# Patient Record
Sex: Female | Born: 1935
Health system: Southern US, Community
[De-identification: ages and names within clinical notes are randomized; demographics above are authoritative.]

## PROBLEM LIST (undated history)

## (undated) DIAGNOSIS — K219 Gastro-esophageal reflux disease without esophagitis: Secondary | ICD-10-CM

## (undated) DIAGNOSIS — K921 Melena: Secondary | ICD-10-CM

## (undated) DIAGNOSIS — F32A Depression, unspecified: Secondary | ICD-10-CM

## (undated) DIAGNOSIS — M1990 Unspecified osteoarthritis, unspecified site: Secondary | ICD-10-CM

## (undated) DIAGNOSIS — N390 Urinary tract infection, site not specified: Secondary | ICD-10-CM

## (undated) DIAGNOSIS — S62109A Fracture of unspecified carpal bone, unspecified wrist, initial encounter for closed fracture: Secondary | ICD-10-CM

## (undated) DIAGNOSIS — E079 Disorder of thyroid, unspecified: Secondary | ICD-10-CM

## (undated) DIAGNOSIS — M4850XA Collapsed vertebra, not elsewhere classified, site unspecified, initial encounter for fracture: Secondary | ICD-10-CM

## (undated) DIAGNOSIS — C801 Malignant (primary) neoplasm, unspecified: Secondary | ICD-10-CM

## (undated) DIAGNOSIS — F419 Anxiety disorder, unspecified: Secondary | ICD-10-CM

## (undated) DIAGNOSIS — F329 Major depressive disorder, single episode, unspecified: Secondary | ICD-10-CM

## (undated) HISTORY — DX: Malignant (primary) neoplasm, unspecified: C80.1

## (undated) HISTORY — DX: Disorder of thyroid, unspecified: E07.9

## (undated) HISTORY — DX: Urinary tract infection, site not specified: N39.0

## (undated) HISTORY — DX: Melena: K92.1

## (undated) HISTORY — DX: Unspecified osteoarthritis, unspecified site: M19.90

## (undated) HISTORY — DX: Depression, unspecified: F32.A

## (undated) HISTORY — DX: Major depressive disorder, single episode, unspecified: F32.9

---

## 1968-01-01 HISTORY — PX: ABDOMINAL HYSTERECTOMY: SHX81

## 1978-12-31 HISTORY — PX: OTHER SURGICAL HISTORY: SHX169

## 2000-01-01 HISTORY — PX: CHOLECYSTECTOMY: SHX55

## 2000-10-01 ENCOUNTER — Encounter: Payer: Self-pay | Admitting: General Surgery

## 2000-10-01 ENCOUNTER — Observation Stay (HOSPITAL_COMMUNITY): Admission: RE | Admit: 2000-10-01 | Discharge: 2000-10-02 | Payer: Self-pay | Admitting: General Surgery

## 2015-05-04 ENCOUNTER — Encounter (HOSPITAL_COMMUNITY): Payer: Self-pay

## 2015-05-09 ENCOUNTER — Inpatient Hospital Stay (HOSPITAL_COMMUNITY): Admission: RE | Admit: 2015-05-09 | Payer: Self-pay | Source: Ambulatory Visit

## 2018-10-13 ENCOUNTER — Telehealth: Payer: Self-pay | Admitting: General Practice

## 2018-10-13 NOTE — Telephone Encounter (Signed)
Copied from [place] [phone]. Topic: Appointment Scheduling - Prior Auth Required for Appointment >> Oct 10, 2018 12:37 PM Wynetta Emery, [place] C wrote: No appointment has been scheduled. Patient is requesting appointment with Dr. Carollee Herter.  Per scheduling protocol, this appointment requires a prior authorization prior to scheduling.--- She said that Erline Levine with [facility] suggested provider to her during a session.  Will PCP take pt on?  Please assist.   Route to department's PEC pool.   CB: [phone] >> Oct 10, 2018 12:45 PM Damita Dunnings, [place] wrote: Dr. Etter Sjogren not currently accepting new patients.  >> Oct 13, 2018  4:32 PM Genella Rife H wrote: Hulen Skains pt and left message Dr. Carollee Herter is not taking on new patients but Dr. Nani Ravens and Mackie Pai is taking on new patients and if she wants to set up appt please give [place] a call back.

## 2018-10-22 NOTE — Telephone Encounter (Signed)
Advised pt that Dr. Carollee Herter is not accepting new pt's at this time.  The pt would like to be put on some sort of waiting list per her request. She would like to be notified once she start to accept new pt's.

## 2018-10-27 ENCOUNTER — Other Ambulatory Visit: Payer: Self-pay

## 2018-10-27 ENCOUNTER — Emergency Department (HOSPITAL_BASED_OUTPATIENT_CLINIC_OR_DEPARTMENT_OTHER)
Admission: EM | Admit: 2018-10-27 | Discharge: 2018-10-27 | Disposition: A | Discharge status: Home / Self Care | Payer: Medicare Other | Attending: Emergency Medicine | Admitting: Emergency Medicine

## 2018-10-27 DIAGNOSIS — X58XXXS Exposure to other specified factors, sequela: Secondary | ICD-10-CM | POA: Diagnosis not present

## 2018-10-27 DIAGNOSIS — S32020S Wedge compression fracture of second lumbar vertebra, sequela: Secondary | ICD-10-CM | POA: Diagnosis not present

## 2018-10-27 DIAGNOSIS — M5489 Other dorsalgia: Secondary | ICD-10-CM | POA: Diagnosis present

## 2018-10-27 MED ORDER — TRAMADOL HCL 50 MG PO TABS: 50.0000 mg | ORAL_TABLET | Freq: Four times a day (QID) | ORAL | 0 refills | Status: DC | PRN

## 2018-10-27 MED FILL — traMADol HCL 50 MG TABS: 50 | 3 days supply | Qty: 15 | Fill #0

## 2018-10-27 NOTE — ED Provider Notes (Signed)
[facility]GENC[facility] DEPARTMENT Provider Note   CSN: [id number] Arrival date & time: 10/27/18  1130     History   Chief Complaint Chief Complaint  Patient presents with  . Back Pain    HPI Tammy Brown is a 82 y.o. female who presents emergency department with chief complaint of back pain.  The patient has a known L2 compression fracture that occurred back in early July of this year.  The patient states that she was discharged with pain medicine and a TLSO's splint but could not tolerate either of them.  She followed up outpatient with an orthopedic spine specialist and had an MRI on 07/28/2018 that showed no significant abnormalities.  They did at that time recommend kyphoplasty however the patient refused.  She has been taking medicine for pain but ran out and states that she is continued to have pain in her low back.  She states that she thinks it is her muscles or her nerves.  She has had difficulty sleeping.  Her husband says that she is so stressed out and nervous that he feels like her muscles cannot relax and think she needs something for stress and sleep.  She denies pain radiating down her leg, urinary incontinence or bowel incontinence, weakness of the lower extremities.  Her pain has not gotten any worse it is the same over the past several days.  She is declining imaging at this time because she has a follow-up appointment.  HPI  No past medical history on file.  There are no active problems to display for this patient.   The histories are not reviewed yet. Please review them in the "History" navigator section and refresh this [place].   OB History   None      Home Medications    Prior to Admission medications   Not on File    Family History No family history on file.  Social History Social History   Tobacco Use  . Smoking status: Not on file  Substance Use Topics  . Alcohol use: Not on file  . Drug use: Not on file     Allergies     Codeine   Review of Systems Review of Systems Ten systems reviewed and are negative for acute change, except as noted in the HPI.    Physical Exam Updated Vital Signs BP (!) 148/85 (BP Location: Right Arm)   Pulse 67   Temp 98.3 F (36.8 C) (Oral)   Resp 18   SpO2 98%   Physical Exam  Physical Exam  Nursing note and vitals reviewed. Constitutional: She is oriented to person, place, and time. She appears well-developed and well-nourished. No distress.  HENT:  Head: Normocephalic and atraumatic.  Eyes: Conjunctivae normal and EOM are normal. Pupils are equal, round, and reactive to light. No scleral icterus.  Neck: Normal range of motion.  Cardiovascular: Normal rate, regular rhythm and normal heart sounds.  Exam reveals no gallop and no friction rub.   No murmur heard. Pulmonary/Chest: Effort normal and breath sounds normal. No respiratory distress.  Abdominal: Soft. Bowel sounds are normal. She exhibits no distension and no mass. There is no tenderness. There is no guarding.  Musculoskeletal: No midline spinal tenderness.  Tender to palpation of the lumbar paraspinal worse on the left, the upper gluteal muscles.  Normal strength of the bilateral lower extremities.  Normal reflexes.  Normal sensation bilaterally.  Range of motion is guarded. Neurological: She is alert and oriented to person, place,  and time.  Skin: Skin is warm and dry. She is not diaphoretic.    ED Treatments / Results  Labs (all labs ordered are listed, but only abnormal results are displayed) Labs Reviewed - No data to display  EKG None  Radiology No results found.  Procedures Procedures (including critical care time)  Medications Ordered in ED Medications - No data to display   Initial Impression / Assessment and Plan / ED Course  I have reviewed the triage vital signs and the nursing notes.  Pertinent labs & imaging results that were available during my care of the patient were reviewed  by me and considered in my medical decision making (see chart for details).     Patient with pain resulting from L2 compression fracture she does not want any further imaging.  I have reviewed the patient in the [place] drug database in she does not have any controlled substance prescription since July of this year.  I will discharge patient with tramadol.  She should follow-up with her primary care physician.  I discussed with any red flag symptoms.  Patient appears appropriate for discharge at this time.  Final Clinical Impressions(s) / ED Diagnoses   Final diagnoses:  Closed compression fracture of L2 lumbar vertebra, sequela    ED Discharge Orders    None       Margarita Mail, PA-C 10/27/18 1740    Tegeler, Gwenyth Allegra, MD 10/27/18 [phone]

## 2018-10-27 NOTE — Discharge Instructions (Addendum)
Contact a health care provider if: You have a fever. You develop a cough that makes your pain worse. Your pain medicine is not helping. Your pain does not get better over time. You cannot return to your normal activities as planned or expected. Get help right away if: Your pain is very bad and it suddenly gets worse. You are unable to move any body part (paralysis) that is below the level of your injury. You have numbness, tingling, or weakness in any body part that is below the level of your injury. You cannot control your bladder or bowels.

## 2018-10-27 NOTE — ED Triage Notes (Signed)
Pt reports "L2 fx" after lifting heavy object in July-was seen by surgeon-surgery not needed-pt has been taking pain meds and is out of some -states she is having increase in lower back pain-pt NAD

## 2018-11-05 ENCOUNTER — Ambulatory Visit (HOSPITAL_BASED_OUTPATIENT_CLINIC_OR_DEPARTMENT_OTHER)
Admission: RE | Admit: 2018-11-05 | Discharge: 2018-11-05 | Disposition: A | Discharge status: Home / Self Care | Payer: Medicare Other | Source: Ambulatory Visit | Attending: Family Medicine | Admitting: Family Medicine

## 2018-11-05 ENCOUNTER — Ambulatory Visit (INDEPENDENT_AMBULATORY_CARE_PROVIDER_SITE_OTHER): Payer: Medicare Other | Admitting: Family Medicine

## 2018-11-05 ENCOUNTER — Encounter: Payer: Self-pay | Admitting: Family Medicine

## 2018-11-05 VITALS — BP 114/72 | HR 79 | Temp 98.8°F | Ht 64.0 in | Wt 169.1 lb

## 2018-11-05 DIAGNOSIS — X58XXXA Exposure to other specified factors, initial encounter: Secondary | ICD-10-CM | POA: Insufficient documentation

## 2018-11-05 DIAGNOSIS — S32020A Wedge compression fracture of second lumbar vertebra, initial encounter for closed fracture: Secondary | ICD-10-CM | POA: Insufficient documentation

## 2018-11-05 DIAGNOSIS — M545 Low back pain, unspecified: Secondary | ICD-10-CM | POA: Insufficient documentation

## 2018-11-05 DIAGNOSIS — M549 Dorsalgia, unspecified: Secondary | ICD-10-CM | POA: Insufficient documentation

## 2018-11-05 DIAGNOSIS — G8929 Other chronic pain: Secondary | ICD-10-CM | POA: Diagnosis not present

## 2018-11-05 NOTE — Progress Notes (Signed)
Pre visit review using our clinic review tool, if applicable. No additional management support is needed unless otherwise documented below in the visit note. 

## 2018-11-05 NOTE — Patient Instructions (Addendum)
We will be in contact regarding your X-ray. I am thinking your muscle is more the cause than your bone, but we will make sure this is not related to worsening bone issues.   If the X-ray does not show any changes, I think we can ramp up activity at physical therapy.  Heat (pad or rice pillow in microwave) over affected area, 10-15 minutes twice daily.   OK to take Tylenol 1000 mg (2 extra strength tabs) or 975 mg (3 regular strength tabs) every 6 hours as needed.  Let us know if you need anything.

## 2018-11-05 NOTE — Progress Notes (Signed)
Chief Complaint  Patient presents with  . New Patient (Initial Visit)       New Patient Visit SUBJECTIVE: HPI: Tammy Brown is an 82 y.o.female who is being seen for establishing care.  The patient's previous PCP left.   Hx of L2 compression fx. Declined kyphoplasty, surg not required per ortho spine. Has trouble taking PO meds due to AE's. This has been going on for the past 5 mo. Was told this would improve by now. Has been working with PT. Tries to be diligent w HEP. No numbness, tingling, bowel/bladder incontinence. Has had MRI and XR showing compression fx.   Allergies  Allergen Reactions  . Codeine    Past Medical History:  Diagnosis Date  . Arthritis   . Blood in stool   . Cancer [facility])    colon cancer  . Depression   . Thyroid disease   . UTI (urinary tract infection)    Past Surgical History:  Procedure Laterality Date  . ABDOMINAL HYSTERECTOMY  1969  . CHOLECYSTECTOMY  2001  . colon cancer  1980   Family History  Problem Relation Age of Onset  . Cancer Mother   . Early death Mother   . COPD Father   . Arthritis Sister   . COPD Sister    Allergies  Allergen Reactions  . Codeine     Current Outpatient Medications:  .  ascorbic acid (VITAMIN C) 500 MG tablet, Take 500 mg by mouth daily., Disp: , Rfl:  .  b complex vitamins tablet, Take 1 tablet by mouth daily., Disp: , Rfl:  .  Calcium-Magnesium 500-250 MG TABS, Take 1 tablet by mouth daily., Disp: , Rfl:  .  cetirizine (ZYRTEC) 10 MG tablet, Take 10 mg by mouth daily. Spring and fall, Disp: , Rfl:  .  Cholecalciferol (VITAMIN D3) 25 MCG (1000 UT) CAPS, Take 1 capsule by mouth daily., Disp: , Rfl:  .  Coenzyme Q10 (COQ10) 100 MG CAPS, Take 1 capsule by mouth daily., Disp: , Rfl:  .  Melatonin 3-10 MG TABS, Take 1 tablet by mouth daily., Disp: , Rfl:  .  traMADol (ULTRAM) 50 MG tablet, Take 1 tablet (50 mg total) by mouth every 6 (six) hours as needed., Disp: 15 tablet, Rfl: 0  ROS MSK: +back pain   Neuro: Denies numbness/tingling   OBJECTIVE: BP 114/72 (BP Location: Left Arm, Patient Position: Sitting, Cuff Size: Normal)   Pulse 79   Temp 98.8 F (37.1 C) (Oral)   Ht 5\' 4"  (1.626 m)   Wt 169 lb 2 oz (76.7 kg)   SpO2 97%   BMI 29.03 kg/m   Constitutional: -  VS reviewed -  Well developed, well nourished, appears stated age -  No apparent distress  Psychiatric: -  Oriented to person, place, and time -  Memory intact -  Affect and mood normal -  Fluent conversation, good eye contact -  Judgment and insight age appropriate  Eye: -  Conjunctivae clear, no discharge -  Pupils symmetric, round, reactive to light  ENMT: -  MMM    Pharynx moist, no exudate, no erythema  Neck: -  No gross swelling, no palpable masses -  Thyroid midline, not enlarged, mobile, no palpable masses  Cardiovascular: -  RRR -  No LE edema  Respiratory: -  Normal respiratory effort, no accessory muscle use, no retraction -  Breath sounds equal, no wheezes, no ronchi, no crackles  Neurological:  -  CN II - XII  grossly intact -  DTR's equal and symmetric, no clonus -  No cerebellar signs -  Sensation grossly intact to light touch, equal bilaterally  Musculoskeletal: -  No clubbing, no cyanosis -  +ttp over thoracolumb parasp msc on R -  No midline ttp -  Poor rom of hamstrings b/l, neg straight leg b/l  Skin: -  No significant lesion on inspection -  Warm and dry to palpation   ASSESSMENT/PLAN: Closed compression fracture of L2 lumbar vertebra, initial encounter ([place]) - Plan: DG Lumbar Spine Complete  Chronic right-sided low back pain without sciatica  Patient instructed to sign release of records form from her previous PCP. Will ck XR to ensure no worsening of compression fx. If so, will refer to spine specialist. If not, will increase intensity of PT. Heat, ice, Tylenol. Patient should return in 6 mo for AWV. The patient voiced understanding and agreement to the plan.   [place], DO 11/05/18  4:14 PM

## 2018-11-06 ENCOUNTER — Telehealth: Payer: Self-pay | Admitting: Family Medicine

## 2018-11-06 DIAGNOSIS — S32020A Wedge compression fracture of second lumbar vertebra, initial encounter for closed fracture: Secondary | ICD-10-CM

## 2018-11-06 NOTE — Telephone Encounter (Signed)
-----   Message from Jamesetta [place], [place] sent at 11/06/2018  2:08 PM EST ----- Called informed the patient of results/instructions.  The patient would like a call from the provider as to what he thinks may need to be done to correct this problem and where the referral would be sent.

## 2018-11-06 NOTE — Telephone Encounter (Signed)
Referral placed for NS. Pt notified via phone. All questions answered.

## 2019-01-14 ENCOUNTER — Other Ambulatory Visit: Payer: Self-pay | Admitting: Neurosurgery

## 2019-01-20 NOTE — Pre-Procedure Instructions (Signed)
Tammy Brown  01/20/2019      [place], [place] [address] [place] [address] Phone: [phone] Fax: [phone]    Your procedure is scheduled on 01/26/2019.  Report to [facility] Admitting at 1000 A.M.  Call this number if you have problems the morning of surgery:  [phone]   Remember:  Do not eat or drink after midnight.    Take these medicines the morning of surgery with A SIP OF WATER: certirizine (Zyrtec) - if needed  7 days prior to surgery STOP taking any Aspirin (unless otherwise instructed by your surgeon), Aleve, Naproxen, Ibuprofen, Motrin, Advil, Goody's, BC's, all herbal medications, fish oil, and all vitamins.     Do not wear jewelry, make-up or nail polish.  Do not wear lotions, powders, or perfumes, or deodorant.  Do not shave 48 hours prior to surgery.    Do not bring valuables to the hospital.  [facility] is not responsible for any belongings or valuables.  Contacts, eyeglasses, hearing aids, dentures or bridgework may not be worn into surgery.  Leave your suitcase in the car.  After surgery it may be brought to your room.  For patients admitted to the hospital, discharge time will be determined by your treatment team.  Patients discharged the day of surgery will not be allowed to drive home.   Name and phone number of your driver:    Special instructions:   [facility]- Preparing For Surgery  Before surgery, you can play an important role. Because skin is not sterile, your skin needs to be as free of germs as possible. You can reduce the number of germs on your skin by washing with CHG (chlorahexidine gluconate) Soap before surgery.  CHG is an antiseptic cleaner which kills germs and bonds with the skin to continue killing germs even after washing.    Oral Hygiene is also important to reduce your risk of infection.  Remember - BRUSH YOUR TEETH THE  MORNING OF SURGERY WITH YOUR REGULAR TOOTHPASTE  Please do not use if you have an allergy to CHG or antibacterial soaps. If your skin becomes reddened/irritated stop using the CHG.  Do not shave (including legs and underarms) for at least 48 hours prior to first CHG shower. It is OK to shave your face.  Please follow these instructions carefully.   1. Shower the NIGHT BEFORE SURGERY and the MORNING OF SURGERY with CHG.   2. If you chose to wash your hair, wash your hair first as usual with your normal shampoo.  3. After you shampoo, rinse your hair and body thoroughly to remove the shampoo.  4. Use CHG as you would any other liquid soap. You can apply CHG directly to the skin and wash gently with a scrungie or a clean washcloth.   5. Apply the CHG Soap to your body ONLY FROM THE NECK DOWN.  Do not use on open wounds or open sores. Avoid contact with your eyes, ears, mouth and genitals (private parts). Wash Face and genitals (private parts)  with your normal soap.  6. Wash thoroughly, paying special attention to the area where your surgery will be performed.  7. Thoroughly rinse your body with warm water from the neck down.  8. DO NOT shower/wash with your normal soap after using and rinsing off the CHG Soap.  9. Pat yourself dry with a CLEAN TOWEL.  10. Wear CLEAN PAJAMAS to bed the night before surgery, wear comfortable clothes the morning of surgery  11. Place CLEAN SHEETS on your bed the night of your first shower and DO NOT SLEEP WITH PETS.    Day of Surgery:  Do not apply any deodorants/lotions.  Please wear clean clothes to the hospital/surgery center.   Remember to brush your teeth WITH YOUR REGULAR TOOTHPASTE.    Please read over the following fact sheets that you were given.

## 2019-01-21 ENCOUNTER — Encounter (HOSPITAL_COMMUNITY): Payer: Self-pay

## 2019-01-21 ENCOUNTER — Encounter (HOSPITAL_COMMUNITY)
Admission: RE | Admit: 2019-01-21 | Discharge: 2019-01-21 | Disposition: A | Discharge status: Home / Self Care | Payer: Medicare Other | Source: Ambulatory Visit | Attending: Neurosurgery | Admitting: Neurosurgery

## 2019-01-21 ENCOUNTER — Other Ambulatory Visit: Payer: Self-pay

## 2019-01-21 DIAGNOSIS — Z01812 Encounter for preprocedural laboratory examination: Secondary | ICD-10-CM | POA: Insufficient documentation

## 2019-01-21 HISTORY — DX: Fracture of unspecified carpal bone, unspecified wrist, initial encounter for closed fracture: S62.109A

## 2019-01-21 HISTORY — DX: Anxiety disorder, unspecified: F41.9

## 2019-01-21 HISTORY — DX: Gastro-esophageal reflux disease without esophagitis: K21.9

## 2019-01-21 HISTORY — DX: Collapsed vertebra, not elsewhere classified, site unspecified, initial encounter for fracture: M48.50XA

## 2019-01-21 LAB — CBC
HCT: 38.3 % (ref 36.0–46.0)
Hemoglobin: 13.8 g/dL (ref 12.0–15.0)
MCH: 33.7 pg (ref 26.0–34.0)
MCHC: 36 g/dL (ref 30.0–36.0)
MCV: 93.6 fL (ref 80.0–100.0)
Platelets: 375 10*3/uL (ref 150–400)
RBC: 4.09 MIL/uL (ref 3.87–5.11)
RDW: 11.7 % (ref 11.5–15.5)
WBC: 6.1 10*3/uL (ref 4.0–10.5)
nRBC: 0 % (ref 0.0–0.2)

## 2019-01-21 LAB — BASIC METABOLIC PANEL
ANION GAP: 10 (ref 5–15)
BUN: 14 mg/dL (ref 8–23)
CO2: 24 mmol/L (ref 22–32)
Calcium: 9.6 mg/dL (ref 8.9–10.3)
Chloride: 106 mmol/L (ref 98–111)
Creatinine, Ser: 0.91 mg/dL (ref 0.44–1.00)
GFR calc Af Amer: >60 mL/min (ref >60)
GFR, EST NON AFRICAN AMERICAN: 59 mL/min — AB (ref >60)
GLUCOSE: 128 mg/dL — AB (ref 70–99)
Potassium: 3.6 mmol/L (ref 3.5–5.1)
Sodium: 140 mmol/L (ref 135–145)

## 2019-01-21 LAB — SURGICAL PCR SCREEN
MRSA, PCR: NEGATIVE
Staphylococcus aureus: NEGATIVE

## 2019-01-21 NOTE — Progress Notes (Signed)
PCP - Dr. Clayburn Pert- Jule Ser  Cardiologist - Denies  Chest x-ray - 03/10/18 (CE)  EKG - 03/10/18 (CE)- Req'd from [facility]  Stress Test - Denies  ECHO - Denies  Cardiac Cath - Denies  AICD- na PM- na LOOP- na  Sleep Study - Denies CPAP - None  LABS- 01/21/2019: CBC, BMP, PCR  ASA- Denies   Anesthesia- Yes- req'd EKG  Pt denies having chest pain, sob, or fever at this time. All instructions explained to the pt, with a verbal understanding of the material. Pt agrees to go over the instructions while at home for a better understanding. The opportunity to ask questions was provided.

## 2019-01-22 NOTE — Anesthesia Preprocedure Evaluation (Addendum)
Anesthesia Evaluation  Patient identified by MRN, date of birth, ID band Patient awake    Reviewed: Allergy & Precautions, H&P , NPO status , Patient's Chart, lab work & pertinent test results, reviewed documented beta blocker date and time   Airway Mallampati: II  TM Distance: >3 FB Neck ROM: full    Dental no notable dental hx. (+) Teeth Intact, Dental Advisory Given   Pulmonary neg pulmonary ROS,    Pulmonary exam normal breath sounds clear to auscultation       Cardiovascular Exercise Tolerance: Good negative cardio ROS   Rhythm:regular Rate:Normal     Neuro/Psych negative neurological ROS  negative psych ROS   GI/Hepatic Neg liver ROS, GERD  Controlled,  Endo/Other  negative endocrine ROS  Renal/GU negative Renal ROS  negative genitourinary   Musculoskeletal  (+) Arthritis , Osteoarthritis,    Abdominal   Peds  Hematology negative hematology ROS (+)   Anesthesia Other Findings   Reproductive/Obstetrics negative OB ROS                            Anesthesia Physical Anesthesia Plan  ASA: III  Anesthesia Plan: General   Post-op Pain Management:    Induction: Intravenous  PONV Risk Score and Plan: 3 and Treatment may vary due to age or medical condition, Ondansetron and Dexamethasone  Airway Management Planned: Oral ETT  Additional Equipment:   Intra-op Plan:   Post-operative Plan: Extubation in OR  Informed Consent: I have reviewed the patients History and Physical, chart, labs and discussed the procedure including the risks, benefits and alternatives for the proposed anesthesia with the patient or authorized representative who has indicated his/her understanding and acceptance.     Dental Advisory Given  Plan Discussed with: CRNA, Anesthesiologist and Surgeon  Anesthesia Plan Comments: (EKG tracing from PCP 03/10/2018 on pt chart shows NSR, rate 82.)       Anesthesia Quick Evaluation

## 2019-01-26 ENCOUNTER — Ambulatory Visit (HOSPITAL_COMMUNITY): Payer: Medicare Other

## 2019-01-26 ENCOUNTER — Encounter (HOSPITAL_COMMUNITY): Payer: Self-pay | Admitting: Certified Registered Nurse Anesthetist

## 2019-01-26 ENCOUNTER — Ambulatory Visit (HOSPITAL_COMMUNITY)
Admission: RE | Admit: 2019-01-26 | Discharge: 2019-01-26 | Disposition: A | Discharge status: Home / Self Care | Payer: Medicare Other | Attending: Neurosurgery | Admitting: Neurosurgery

## 2019-01-26 ENCOUNTER — Other Ambulatory Visit: Payer: Self-pay

## 2019-01-26 ENCOUNTER — Encounter (HOSPITAL_COMMUNITY)
Admission: RE | Disposition: A | Discharge status: Home / Self Care | Payer: Self-pay | Source: Home / Self Care | Attending: Neurosurgery

## 2019-01-26 ENCOUNTER — Ambulatory Visit (HOSPITAL_COMMUNITY): Payer: Medicare Other | Admitting: Physician Assistant

## 2019-01-26 ENCOUNTER — Ambulatory Visit (HOSPITAL_COMMUNITY): Payer: Medicare Other | Admitting: Anesthesiology

## 2019-01-26 DIAGNOSIS — E079 Disorder of thyroid, unspecified: Secondary | ICD-10-CM | POA: Insufficient documentation

## 2019-01-26 DIAGNOSIS — Z79899 Other long term (current) drug therapy: Secondary | ICD-10-CM | POA: Diagnosis not present

## 2019-01-26 DIAGNOSIS — M1990 Unspecified osteoarthritis, unspecified site: Secondary | ICD-10-CM | POA: Diagnosis not present

## 2019-01-26 DIAGNOSIS — X509XXA Other and unspecified overexertion or strenuous movements or postures, initial encounter: Secondary | ICD-10-CM | POA: Insufficient documentation

## 2019-01-26 DIAGNOSIS — Z85038 Personal history of other malignant neoplasm of large intestine: Secondary | ICD-10-CM | POA: Insufficient documentation

## 2019-01-26 DIAGNOSIS — K219 Gastro-esophageal reflux disease without esophagitis: Secondary | ICD-10-CM | POA: Diagnosis not present

## 2019-01-26 DIAGNOSIS — R2689 Other abnormalities of gait and mobility: Secondary | ICD-10-CM | POA: Diagnosis not present

## 2019-01-26 DIAGNOSIS — S32000G Wedge compression fracture of unspecified lumbar vertebra, subsequent encounter for fracture with delayed healing: Secondary | ICD-10-CM

## 2019-01-26 DIAGNOSIS — Z885 Allergy status to narcotic agent status: Secondary | ICD-10-CM | POA: Diagnosis not present

## 2019-01-26 DIAGNOSIS — S32029A Unspecified fracture of second lumbar vertebra, initial encounter for closed fracture: Secondary | ICD-10-CM | POA: Diagnosis present

## 2019-01-26 DIAGNOSIS — Z419 Encounter for procedure for purposes other than remedying health state, unspecified: Secondary | ICD-10-CM

## 2019-01-26 HISTORY — PX: KYPHOPLASTY: SHX5884

## 2019-01-26 SURGERY — KYPHOPLASTY
Anesthesia: General | Site: Back

## 2019-01-26 MED ORDER — ACETAMINOPHEN 500 MG PO TABS: 1000.0000 mg | ORAL_TABLET | Freq: Four times a day (QID) | ORAL | Status: DC

## 2019-01-26 MED ORDER — HYDROMORPHONE HCL 2 MG PO TABS
2.0000 mg | ORAL_TABLET | ORAL | Status: DC | PRN
  Administered 2019-01-26: 2 mg via ORAL
  Filled 2019-01-26: qty 1

## 2019-01-26 MED ORDER — CEFAZOLIN SODIUM-DEXTROSE 2-4 GM/100ML-% IV SOLN: 2.0000 g | Freq: Three times a day (TID) | INTRAVENOUS | Status: DC

## 2019-01-26 MED ORDER — ROCURONIUM BROMIDE 10 MG/ML (PF) SYRINGE
PREFILLED_SYRINGE | INTRAVENOUS | Status: DC | PRN
  Administered 2019-01-26: 50 mg via INTRAVENOUS

## 2019-01-26 MED ORDER — MEPERIDINE HCL 50 MG/ML IJ SOLN: 6.2500 mg | INTRAMUSCULAR | Status: DC | PRN

## 2019-01-26 MED ORDER — HYDROMORPHONE HCL 2 MG PO TABS: 2.0000 mg | ORAL_TABLET | ORAL | 0 refills | Status: AC | PRN

## 2019-01-26 MED ORDER — ACETAMINOPHEN 325 MG PO TABS: 650.0000 mg | ORAL_TABLET | ORAL | Status: DC | PRN

## 2019-01-26 MED ORDER — ZOLPIDEM TARTRATE 5 MG PO TABS: 5.0000 mg | ORAL_TABLET | Freq: Every evening | ORAL | Status: DC | PRN

## 2019-01-26 MED ORDER — SODIUM CHLORIDE 0.9% FLUSH
3.0000 mL | Freq: Two times a day (BID) | INTRAVENOUS | Status: DC
  Administered 2019-01-26: 3 mL via INTRAVENOUS

## 2019-01-26 MED ORDER — SODIUM CHLORIDE 0.9% FLUSH: 3.0000 mL | INTRAVENOUS | Status: DC | PRN

## 2019-01-26 MED ORDER — ONDANSETRON HCL 4 MG/2ML IJ SOLN: 4.0000 mg | Freq: Once | INTRAMUSCULAR | Status: DC | PRN

## 2019-01-26 MED ORDER — FENTANYL CITRATE (PF) 100 MCG/2ML IJ SOLN
INTRAMUSCULAR | Status: DC | PRN
  Administered 2019-01-26: 75 ug via INTRAVENOUS

## 2019-01-26 MED ORDER — OXYCODONE HCL 5 MG/5ML PO SOLN: 5.0000 mg | Freq: Once | ORAL | Status: DC | PRN

## 2019-01-26 MED ORDER — BACITRACIN ZINC 500 UNIT/GM EX OINT
TOPICAL_OINTMENT | CUTANEOUS | Status: AC
  Filled 2019-01-26: qty 28.35

## 2019-01-26 MED ORDER — FENTANYL CITRATE (PF) 250 MCG/5ML IJ SOLN
INTRAMUSCULAR | Status: AC
  Filled 2019-01-26: qty 5

## 2019-01-26 MED ORDER — LIDOCAINE 2% (20 MG/ML) 5 ML SYRINGE
INTRAMUSCULAR | Status: AC
  Filled 2019-01-26: qty 10

## 2019-01-26 MED ORDER — LACTATED RINGERS IV SOLN
INTRAVENOUS | Status: DC
  Administered 2019-01-26: 10:00:00 via INTRAVENOUS

## 2019-01-26 MED ORDER — ONDANSETRON HCL 4 MG/2ML IJ SOLN
INTRAMUSCULAR | Status: DC | PRN
  Administered 2019-01-26: 4 mg via INTRAVENOUS

## 2019-01-26 MED ORDER — IOPAMIDOL (ISOVUE-300) INJECTION 61%
INTRAVENOUS | Status: AC
  Filled 2019-01-26: qty 50

## 2019-01-26 MED ORDER — LIDOCAINE HCL (CARDIAC) PF 100 MG/5ML IV SOSY
PREFILLED_SYRINGE | INTRAVENOUS | Status: DC | PRN
  Administered 2019-01-26: 60 mg via INTRAVENOUS

## 2019-01-26 MED ORDER — PHENYLEPHRINE 40 MCG/ML (10ML) SYRINGE FOR IV PUSH (FOR BLOOD PRESSURE SUPPORT)
PREFILLED_SYRINGE | INTRAVENOUS | Status: DC | PRN
  Administered 2019-01-26 (×4): 80 ug via INTRAVENOUS

## 2019-01-26 MED ORDER — CEFAZOLIN SODIUM-DEXTROSE 2-4 GM/100ML-% IV SOLN
2.0000 g | INTRAVENOUS | Status: AC
  Administered 2019-01-26: 2 g via INTRAVENOUS

## 2019-01-26 MED ORDER — DOCUSATE SODIUM 100 MG PO CAPS: 100.0000 mg | ORAL_CAPSULE | Freq: Two times a day (BID) | ORAL | 0 refills | Status: AC

## 2019-01-26 MED ORDER — METAXALONE 400 MG HALF TABLET
400.0000 mg | ORAL_TABLET | Freq: Every day | ORAL | Status: DC | PRN
  Filled 2019-01-26: qty 1

## 2019-01-26 MED ORDER — BUPIVACAINE-EPINEPHRINE (PF) 0.25% -1:200000 IJ SOLN
INTRAMUSCULAR | Status: AC
  Filled 2019-01-26: qty 30

## 2019-01-26 MED ORDER — DOCUSATE SODIUM 100 MG PO CAPS: 100.0000 mg | ORAL_CAPSULE | Freq: Two times a day (BID) | ORAL | Status: DC

## 2019-01-26 MED ORDER — LORATADINE 10 MG PO TABS: 10.0000 mg | ORAL_TABLET | Freq: Every day | ORAL | Status: DC | PRN

## 2019-01-26 MED ORDER — DEXAMETHASONE SODIUM PHOSPHATE 10 MG/ML IJ SOLN
INTRAMUSCULAR | Status: DC | PRN
  Administered 2019-01-26: 10 mg via INTRAVENOUS

## 2019-01-26 MED ORDER — IOPAMIDOL (ISOVUE-300) INJECTION 61%
INTRAVENOUS | Status: DC | PRN
  Administered 2019-01-26: 50 mL

## 2019-01-26 MED ORDER — BISACODYL 10 MG RE SUPP: 10.0000 mg | Freq: Every day | RECTAL | Status: DC | PRN

## 2019-01-26 MED ORDER — ONDANSETRON HCL 4 MG/2ML IJ SOLN
4.0000 mg | Freq: Four times a day (QID) | INTRAMUSCULAR | Status: DC | PRN
  Administered 2019-01-26: 4 mg via INTRAVENOUS
  Filled 2019-01-26: qty 2

## 2019-01-26 MED ORDER — PHENOL 1.4 % MT LIQD: 1.0000 | OROMUCOSAL | Status: DC | PRN

## 2019-01-26 MED ORDER — ONDANSETRON HCL 4 MG PO TABS: 4.0000 mg | ORAL_TABLET | Freq: Four times a day (QID) | ORAL | Status: DC | PRN

## 2019-01-26 MED ORDER — FENTANYL CITRATE (PF) 100 MCG/2ML IJ SOLN
INTRAMUSCULAR | Status: AC
  Administered 2019-01-26: 50 ug via INTRAVENOUS
  Filled 2019-01-26: qty 2

## 2019-01-26 MED ORDER — BACITRACIN ZINC 500 UNIT/GM EX OINT
TOPICAL_OINTMENT | CUTANEOUS | Status: DC | PRN
  Administered 2019-01-26: 1 via TOPICAL

## 2019-01-26 MED ORDER — MENTHOL 3 MG MT LOZG: 1.0000 | LOZENGE | OROMUCOSAL | Status: DC | PRN

## 2019-01-26 MED ORDER — CYCLOBENZAPRINE HCL 10 MG PO TABS
ORAL_TABLET | ORAL | Status: AC
  Filled 2019-01-26: qty 1

## 2019-01-26 MED ORDER — FENTANYL CITRATE (PF) 100 MCG/2ML IJ SOLN
25.0000 ug | INTRAMUSCULAR | Status: DC | PRN
  Administered 2019-01-26: 50 ug via INTRAVENOUS
  Administered 2019-01-26: 25 ug via INTRAVENOUS

## 2019-01-26 MED ORDER — 0.9 % SODIUM CHLORIDE (POUR BTL) OPTIME
TOPICAL | Status: DC | PRN
  Administered 2019-01-26: 1000 mL

## 2019-01-26 MED ORDER — ACETAMINOPHEN 160 MG/5ML PO SOLN: 325.0000 mg | ORAL | Status: DC | PRN

## 2019-01-26 MED ORDER — SODIUM CHLORIDE 0.9 % IV SOLN: 250.0000 mL | INTRAVENOUS | Status: DC

## 2019-01-26 MED ORDER — DEXAMETHASONE SODIUM PHOSPHATE 10 MG/ML IJ SOLN
INTRAMUSCULAR | Status: AC
  Filled 2019-01-26: qty 1

## 2019-01-26 MED ORDER — CHLORHEXIDINE GLUCONATE CLOTH 2 % EX PADS: 6.0000 | MEDICATED_PAD | Freq: Once | CUTANEOUS | Status: DC

## 2019-01-26 MED ORDER — PROPOFOL 10 MG/ML IV BOLUS
INTRAVENOUS | Status: AC
  Filled 2019-01-26: qty 20

## 2019-01-26 MED ORDER — CYCLOBENZAPRINE HCL 10 MG PO TABS
10.0000 mg | ORAL_TABLET | Freq: Three times a day (TID) | ORAL | Status: DC | PRN
  Administered 2019-01-26: 10 mg via ORAL

## 2019-01-26 MED ORDER — ROCURONIUM BROMIDE 50 MG/5ML IV SOSY
PREFILLED_SYRINGE | INTRAVENOUS | Status: AC
  Filled 2019-01-26: qty 15

## 2019-01-26 MED ORDER — MORPHINE SULFATE (PF) 4 MG/ML IV SOLN: 4.0000 mg | INTRAVENOUS | Status: DC | PRN

## 2019-01-26 MED ORDER — OXYCODONE HCL 5 MG PO TABS: 5.0000 mg | ORAL_TABLET | Freq: Once | ORAL | Status: DC | PRN

## 2019-01-26 MED ORDER — PROPOFOL 10 MG/ML IV BOLUS
INTRAVENOUS | Status: DC | PRN
  Administered 2019-01-26: 130 mg via INTRAVENOUS

## 2019-01-26 MED ORDER — COQ10 100 MG PO CAPS: 1.0000 | ORAL_CAPSULE | Freq: Every day | ORAL | Status: DC

## 2019-01-26 MED ORDER — ACETAMINOPHEN 650 MG RE SUPP: 650.0000 mg | RECTAL | Status: DC | PRN

## 2019-01-26 MED ORDER — ONDANSETRON HCL 4 MG/2ML IJ SOLN
INTRAMUSCULAR | Status: AC
  Filled 2019-01-26: qty 2

## 2019-01-26 MED ORDER — ACETAMINOPHEN 325 MG PO TABS: 325.0000 mg | ORAL_TABLET | ORAL | Status: DC | PRN

## 2019-01-26 MED ORDER — BUPIVACAINE-EPINEPHRINE 0.5% -1:200000 IJ SOLN
INTRAMUSCULAR | Status: DC | PRN
  Administered 2019-01-26: 10 mL

## 2019-01-26 MED ORDER — PHENYLEPHRINE 40 MCG/ML (10ML) SYRINGE FOR IV PUSH (FOR BLOOD PRESSURE SUPPORT)
PREFILLED_SYRINGE | INTRAVENOUS | Status: AC
  Filled 2019-01-26: qty 20

## 2019-01-26 MED ORDER — VITAMIN D 25 MCG (1000 UNIT) PO TABS: 1000.0000 [IU] | ORAL_TABLET | Freq: Every day | ORAL | Status: DC

## 2019-01-26 SURGICAL SUPPLY — 44 items
APL SKNCLS STERI-STRIP NONHPOA (GAUZE/BANDAGES/DRESSINGS) ×1
BENZOIN TINCTURE PRP APPL 2/3 (GAUZE/BANDAGES/DRESSINGS) ×3 IMPLANT
BLADE CLIPPER SURG (BLADE) IMPLANT
BLADE SURG 15 STRL LF DISP TIS (BLADE) ×1 IMPLANT
BLADE SURG 15 STRL SS (BLADE) ×3
CARTRIDGE OIL MAESTRO DRILL (MISCELLANEOUS) IMPLANT
CEMENT KYPHON C01A KIT/MIXER (Cement) ×3 IMPLANT
CLOSURE WOUND 1/2 X4 (GAUZE/BANDAGES/DRESSINGS) ×1
CONT SPEC 4OZ CLIKSEAL STRL BL (MISCELLANEOUS) ×3 IMPLANT
COVER WAND RF STERILE (DRAPES) IMPLANT
DEVICE BIOPSY BONE KYPHX (INSTRUMENTS) ×3 IMPLANT
DIFFUSER DRILL AIR PNEUMATIC (MISCELLANEOUS) IMPLANT
DRAPE C-ARM 42X72 X-RAY (DRAPES) ×3 IMPLANT
DRAPE HALF SHEET 40X57 (DRAPES) ×3 IMPLANT
DRAPE INCISE IOBAN 66X45 STRL (DRAPES) ×3 IMPLANT
DRAPE LAPAROTOMY 100X72X124 (DRAPES) ×3 IMPLANT
DRAPE SURG 17X23 STRL (DRAPES) ×12 IMPLANT
DRAPE WARM FLUID 44X44 (DRAPE) ×3 IMPLANT
DRSG OPSITE POSTOP 4X8 (GAUZE/BANDAGES/DRESSINGS) ×3 IMPLANT
DRSG TELFA 3X8 NADH (GAUZE/BANDAGES/DRESSINGS) ×3 IMPLANT
GAUZE 4X4 16PLY RFD (DISPOSABLE) ×3 IMPLANT
GAUZE SPONGE 4X4 12PLY STRL (GAUZE/BANDAGES/DRESSINGS) IMPLANT
GLOVE BIO SURGEON STRL SZ8 (GLOVE) ×3 IMPLANT
GLOVE BIO SURGEON STRL SZ8.5 (GLOVE) ×3 IMPLANT
GLOVE EXAM NITRILE XL STR (GLOVE) IMPLANT
GOWN STRL REUS W/ TWL LRG LVL3 (GOWN DISPOSABLE) IMPLANT
GOWN STRL REUS W/ TWL XL LVL3 (GOWN DISPOSABLE) ×2 IMPLANT
GOWN STRL REUS W/TWL LRG LVL3 (GOWN DISPOSABLE)
GOWN STRL REUS W/TWL XL LVL3 (GOWN DISPOSABLE) ×6
KIT BASIN OR (CUSTOM PROCEDURE TRAY) ×3 IMPLANT
KIT TURNOVER KIT B (KITS) ×3 IMPLANT
NEEDLE HYPO 22GX1.5 SAFETY (NEEDLE) ×3 IMPLANT
NS IRRIG 1000ML POUR BTL (IV SOLUTION) ×3 IMPLANT
OIL CARTRIDGE MAESTRO DRILL (MISCELLANEOUS)
PACK EENT II TURBAN DRAPE (CUSTOM PROCEDURE TRAY) IMPLANT
PACK UNIVERSAL I (CUSTOM PROCEDURE TRAY) ×3 IMPLANT
PAD ARMBOARD 7.5X6 YLW CONV (MISCELLANEOUS) ×9 IMPLANT
SPECIMEN JAR SMALL (MISCELLANEOUS) IMPLANT
STRIP CLOSURE SKIN 1/2X4 (GAUZE/BANDAGES/DRESSINGS) ×2 IMPLANT
SUT VIC AB 3-0 SH 8-18 (SUTURE) ×3 IMPLANT
SYR CONTROL 10ML LL (SYRINGE) ×3 IMPLANT
TOWEL GREEN STERILE (TOWEL DISPOSABLE) IMPLANT
TOWEL GREEN STERILE FF (TOWEL DISPOSABLE) ×3 IMPLANT
TRAY KYPHOPAK 20/3 ONESTEP 1ST (MISCELLANEOUS) ×3 IMPLANT

## 2019-01-26 NOTE — H&P (Signed)
Subjective: The patient is an 83 year old white female who has complained of back pain.  She has failed medical management.  She was worked up with lumbar x-rays and lumbar MRI which demonstrated an L2 compression fracture.  I discussed the various treatment options with her.  She has decided to proceed with surgery.  Past Medical History:  Diagnosis Date  . Anxiety   . Arthritis   . Blood in stool   . Cancer [facility])    colon cancer  . Compression fracture of vertebrae (HCC)    L2  . Depression   . GERD (gastroesophageal reflux disease)   . Thyroid disease   . UTI (urinary tract infection)   . Wrist fracture, closed    Left    Past Surgical History:  Procedure Laterality Date  . ABDOMINAL HYSTERECTOMY  1969  . CHOLECYSTECTOMY  2001  . colon cancer  1980    Allergies  Allergen Reactions  . Codeine     Unsure     Social History   Tobacco Use  . Smoking status: Never Smoker  . Smokeless tobacco: Never Used  Substance Use Topics  . Alcohol use: Never    Frequency: Never    Family History  Problem Relation Age of Onset  . Cancer Mother   . Early death Mother   . COPD Father   . Arthritis Sister   . COPD Sister    Prior to Admission medications   Medication Sig Start Date End Date Taking? Authorizing Provider  ascorbic acid (VITAMIN C) 500 MG tablet Take 500 mg by mouth daily.   Yes [provider]  b complex vitamins tablet Take 1 tablet by mouth daily.   Yes [provider]  Calcium-Magnesium 500-250 MG TABS Take 1 tablet by mouth daily.   Yes [provider]  cetirizine (ZYRTEC) 10 MG tablet Take 10 mg by mouth daily as needed for allergies. Spring and fall    Yes [provider]  Cholecalciferol (VITAMIN D3) 25 MCG (1000 UT) CAPS Take 1 capsule by mouth daily.   Yes [provider]  Melatonin 3-10 MG TABS Take 1 tablet by mouth daily.   Yes [provider]  metaxalone (SKELAXIN) 800 MG tablet Take 400 mg by mouth  daily as needed for muscle spasms.   Yes [provider]  Coenzyme Q10 (COQ10) 100 MG CAPS Take 1 capsule by mouth daily.    [provider]     Review of Systems  Positive ROS: As above  All other systems have been reviewed and were otherwise negative with the exception of those mentioned in the HPI and as above.  Objective: Vital signs in last 24 hours: Temp:  [97.4 F (36.3 C)] 97.4 F (36.3 C) (01/27 0940) Pulse Rate:  [77] 77 (01/27 0940) Resp:  [18] 18 (01/27 0940) BP: (154)/(79) 154/79 (01/27 0940) SpO2:  [98 %] 98 % (01/27 0940) Weight:  [76.2 kg] 76.2 kg (01/27 0940) Estimated body mass index is 28.84 kg/m as calculated from the following:   Height as of this encounter: 5\' 4"  (1.626 m).   Weight as of this encounter: 76.2 kg.   General Appearance: Alert Head: Normocephalic, without obvious abnormality, atraumatic Eyes: PERRL, conjunctiva/corneas clear, EOM's intact,    Ears: Normal  Throat: Normal  Neck: Supple, Back: unremarkable Lungs: Clear to auscultation bilaterally, respirations unlabored Heart: Regular rate and rhythm, no murmur, rub or gallop Abdomen: Soft, non-tender Extremities: Extremities normal, atraumatic, no cyanosis or edema Skin:  unremarkable  NEUROLOGIC:   Mental status: alert and oriented,Motor Exam - grossly normal Sensory Exam - grossly normal Reflexes:  Coordination - grossly normal Gait - grossly normal Balance - grossly normal Cranial Nerves: I: smell Not tested  II: visual acuity  OS: Normal  OD: Normal   II: visual fields Full to confrontation  II: pupils Equal, round, reactive to light  III,VII: ptosis None  III,IV,VI: extraocular muscles  Full ROM  V: mastication Normal  V: facial light touch sensation  Normal  V,VII: corneal reflex  Present  VII: facial muscle function - upper  Normal  VII: facial muscle function - lower Normal  VIII: hearing Not tested  IX: soft palate elevation  Normal  IX,X: gag  reflex Present  XI: trapezius strength  5/5  XI: sternocleidomastoid strength 5/5  XI: neck flexion strength  5/5  XII: tongue strength  Normal    Data Review Lab Results  Component Value Date   WBC 6.1 01/21/2019   HGB 13.8 01/21/2019   HCT 38.3 01/21/2019   MCV 93.6 01/21/2019   PLT 375 01/21/2019   Lab Results  Component Value Date   NA 140 01/21/2019   K 3.6 01/21/2019   CL 106 01/21/2019   CO2 24 01/21/2019   BUN 14 01/21/2019   CREATININE 0.91 01/21/2019   GLUCOSE 128 (H) 01/21/2019   No results found for: INR, PROTIME  Assessment/Plan: L2 compression fracture, lumbago: I have discussed the situation with the patient and her daughter.  We have discussed the various treatment options including surgery.  I have described the surgical treatment option of an L2 kyphoplasty.  I have given her a surgical pamphlet.  We have discussed the risks, benefits, alternatives, expected postoperative course, and likelihood of achieving our goals with surgery.  I have answered all her questions.  She has decided to proceed with surgery.   Tammy Brown 01/26/2019 11:50 AM

## 2019-01-26 NOTE — Evaluation (Signed)
Occupational Therapy Evaluation Patient Details Name: JOVANNI ECKHART MRN: [id number] DOB: Mar 11, 1936 Today's Date: 01/26/2019    History of Present Illness Patient is an 83 yo female s/p L2 kyphoplasty   Clinical Impression   PTA, pt was living with her husband and was independent; daughter plans on staying at dc. Currently, pt requires supervision for ADLs and functional mobility using RW. Provided education and handout on back precautions, bed mobility, brace management, grooming, LB ADLs, toileting, and shower transfer; pt demonstrated understanding. Answered all pt questions. Recommend dc home once medically stable per physician. All acute OT needs met and will sign off. Thank you.     Follow Up Recommendations  No OT follow up    Equipment Recommendations  3 in 1 bedside commode    Recommendations for Other Services       Precautions / Restrictions Precautions Precautions: Back Precaution Booklet Issued: Yes (comment) Precaution Comments: verbally reviewed with patient Required Braces or Orthoses: Spinal Brace Spinal Brace: Lumbar corset Restrictions Weight Bearing Restrictions: No      Mobility Bed Mobility Overal bed mobility: Needs Assistance Bed Mobility: Rolling;Sidelying to Sit;Sit to Sidelying Rolling: Supervision Sidelying to sit: Supervision     Sit to sidelying: Supervision General bed mobility comments: supervision for safety and cues for back precautions  Transfers Overall transfer level: Needs assistance   Transfers: Sit to/from Stand Sit to Stand: Supervision         General transfer comment: Supervision for safety    Balance Overall balance assessment: Mild deficits observed, not formally tested                                         ADL either performed or assessed with clinical judgement   ADL Overall ADL's : Needs assistance/impaired                                       General ADL Comments:  Providing education and handout on back precautions, bed mobility, brace management, compensatory techniques for grooming and LB ADLs, toileting, and functional transfers. Pt performing at supervision level     Vision Baseline Vision/History: Wears glasses Wears Glasses: At all times Patient Visual Report: No change from baseline       Perception     Praxis      Pertinent Vitals/Pain Pain Assessment: Faces Faces Pain Scale: Hurts even more Pain Location: low back/buttocks Pain Descriptors / Indicators: Sore Pain Intervention(s): Monitored during session;Repositioned     Hand Dominance Right   Extremity/Trunk Assessment Upper Extremity Assessment Upper Extremity Assessment: Generalized weakness   Lower Extremity Assessment Lower Extremity Assessment: Generalized weakness   Cervical / Trunk Assessment Cervical / Trunk Assessment: Other exceptions Cervical / Trunk Exceptions: s/p L2 kyphoplasty   Communication Communication Communication: HOH   Cognition Arousal/Alertness: Awake/alert Behavior During Therapy: WFL for tasks assessed/performed Overall Cognitive Status: Within Functional Limits for tasks assessed                                     General Comments  Daughter present throughout session    Exercises     Shoulder Instructions      Home Living Family/patient expects to be discharged to:: Private residence Living Arrangements:  Spouse/significant other Available Help at Discharge: Family Type of Home: House Home Access: Stairs to enter Technical brewer of Steps: 1(threshold)   Home Layout: One level     Bathroom Shower/Tub: Occupational psychologist: Handicapped height     Home Equipment: Cane - single point          Prior Functioning/Environment Level of Independence: Independent                 OT Problem List: Impaired balance (sitting and/or standing);Decreased activity tolerance;Decreased range of  motion;Decreased strength;Decreased knowledge of use of DME or AE;Decreased knowledge of precautions;Pain      OT Treatment/Interventions:      OT Goals(Current goals can be found in the care plan section) Acute Rehab OT Goals Patient Stated Goal: "Go home tonight hopefully" OT Goal Formulation: All assessment and education complete, DC therapy  OT Frequency:     Barriers to D/C:            Co-evaluation              AM-PAC OT "6 Clicks" Daily Activity     Outcome Measure Help from another person eating meals?: None Help from another person taking care of personal grooming?: None Help from another person toileting, which includes using toliet, bedpan, or urinal?: None Help from another person bathing (including washing, rinsing, drying)?: None Help from another person to put on and taking off regular upper body clothing?: None Help from another person to put on and taking off regular lower body clothing?: None 6 Click Score: 24   End of Session Equipment Utilized During Treatment: Back brace Nurse Communication: Mobility status;Precautions  Activity Tolerance: Patient tolerated treatment well Patient left: in bed;with call bell/phone within reach  OT Visit Diagnosis: Other abnormalities of gait and mobility (R26.89);Pain Pain - part of body: (Back)                Time: 3167-4255 OT Time Calculation (min): 22 min Charges:  OT General Charges $OT Visit: 1 Visit OT Evaluation $OT Eval Low Complexity: [place], OTR/L Acute Rehab Pager: [phone] Office: [place] 01/26/2019, 5:29 PM

## 2019-01-26 NOTE — Anesthesia Postprocedure Evaluation (Signed)
Anesthesia Post Note  Patient: Tammy Brown  Procedure(s) Performed: LUMBAR TWO KYPHOPLASTY (N/A Back)     Patient location during evaluation: PACU Anesthesia Type: General Level of consciousness: awake and alert Pain management: pain level controlled Vital Signs Assessment: post-procedure vital signs reviewed and stable Respiratory status: spontaneous breathing, nonlabored ventilation, respiratory function stable and patient connected to nasal cannula oxygen Cardiovascular status: blood pressure returned to baseline and stable Postop Assessment: no apparent nausea or vomiting Anesthetic complications: no    Last Vitals:  Vitals:   01/26/19 1435 01/26/19 1511  BP: 127/67 137/61  Pulse: 62 63  Resp: (!) 9 18  Temp: 36.6 C 36.6 C  SpO2: 99% 94%    Last Pain:  Vitals:   01/26/19 1710  TempSrc:   PainSc: 3                  Naimah Yingst

## 2019-01-26 NOTE — Anesthesia Procedure Notes (Signed)
Procedure Name: Intubation Date/Time: 01/26/2019 12:22 PM Performed by: Carney Living, CRNA Pre-anesthesia Checklist: Patient identified, Emergency Drugs available, Suction available, Patient being monitored and Timeout performed Patient Re-evaluated:Patient Re-evaluated prior to induction Oxygen Delivery Method: Circle system utilized Preoxygenation: Pre-oxygenation with 100% oxygen Induction Type: IV induction Ventilation: Mask ventilation without difficulty Laryngoscope Size: Mac and 4 Grade View: Grade II Tube type: Oral Tube size: 7.0 mm Number of attempts: 1 Airway Equipment and Method: Stylet Placement Confirmation: ETT inserted through vocal cords under direct vision,  positive ETCO2 and breath sounds checked- equal and bilateral Secured at: 22 cm Tube secured with: Tape Dental Injury: Teeth and Oropharynx as per pre-operative assessment

## 2019-01-26 NOTE — Progress Notes (Signed)
Patient alert and oriented, mae's well, voiding adequate amount of urine, swallowing without difficulty, no c/o pain at time of discharge. Patient discharged home with family. Script and discharged instructions given to patient. Patient and family stated understanding of instructions given. Patient has an appointment with Dr. Jenkins   

## 2019-01-26 NOTE — Op Note (Signed)
Brief history: The patient is an 83 year old white female who lifted an object in July and had the acute onset of back pain.  She failed medical management.  She was worked up with lumbar x-rays and a lumbar MRI which demonstrated an L2 compression fracture.  I discussed the various treatment options with the patient.  She has weighed the risks, benefits and alternatives to surgery and decided proceed with an L2 kyphoplasty.  Preop diagnosis: L2 compression fracture, lumbago  Postop diagnosis: The same  Procedure: L2 kyphoplasty  Surgeon: Dr. Earle Gell  Assistant: None  Anesthesia: General tracheal  Estimated blood loss: Minimal  Specimens: Vertebral body biopsy  Locations: None  Drains: None  Description of procedure: The patient was brought to the operating room by the anesthesia team.  General endotracheal anesthesia was induced.  The patient was turned to the prone position on the chest rolls.  Her lumbosacral region was then prepared with Betadine scrub and Betadine solution.  Sterile drapes were applied.  I then injected the area to be incised with Marcaine with epinephrine solution.  I then used a 15 blade scalpel to make small incisions over the bilateral L2 pedicles.  Under biplanar fluoroscopy I cannulated the bilateral L2 pedicles and vertebral bodies with the Jamshidi needle.  We obtained a vertebral body biopsy via the left L2 pedicle.  We then drilled through the needle, remove the drill, inserted balloons bilaterally, inflated the balloons, deflated balloons bilaterally, removed the balloons.  We then filled in the voice created by the balloons with bone cement under fluoroscopic guidance.  On the left, the bone cement began enter the disc space.  On the right, the bone cement began feeling in anterior to the vertebral body.  We attempted to back up the cannulas and injected a bit more cement.  At this point I felt that I had injected all the cement I could safely insert.  We  then remove the cannulas.  I then reapproximated patient's subcutaneous tissue with interrupted 3-0 Vicryl suture.  We reapproximate the skin with Steri-Strips and benzoin.  A sterile dressing was applied.  The drapes were removed.  By report all sponge, instrument, and needle counts were correct at the end this case.

## 2019-01-26 NOTE — Discharge Summary (Signed)
Physician Discharge Summary  Patient ID: Tammy Brown MRN: [id number] DOB/AGE: 1936-11-08 83 y.o.  Admit date: 01/26/2019 Discharge date: 01/26/2019  Admission Diagnoses:L2 compression fracture, lumbago  Discharge Diagnoses:  The same Active Problems:   Lumbar compression fracture, with delayed healing, subsequent encounter   Discharged Condition: good  Hospital Course:  I performed an L2 kyphoplasty on the patient on 01/26/2019.    The patient's postoperative course was unremarkable.  On the evening of surgery the patient requested discharge home.  She was given written and oral discharge instructions.  All her questions were answered.  Consults:  None Significant Diagnostic Studies: none Treatments: L2 kyphoplasty Discharge Exam: Blood pressure 137/61, pulse 63, temperature 97.8 F (36.6 C), temperature source Oral, resp. rate 18, height 5\' 4"  (1.626 m), weight 76.2 kg, SpO2 94 %.  the patient is alert and pleasant.  her strength is normal.  Disposition:   Home  Discharge Instructions     Remove dressing in 72 hours   Complete by:  As directed    Call MD for:  difficulty breathing, headache or visual disturbances   Complete by:  As directed    Call MD for:  extreme fatigue   Complete by:  As directed    Call MD for:  hives   Complete by:  As directed    Call MD for:  persistant dizziness or light-headedness   Complete by:  As directed    Call MD for:  persistant nausea and vomiting   Complete by:  As directed    Call MD for:  redness, tenderness, or signs of infection (pain, swelling, redness, odor or green/yellow discharge around incision site)   Complete by:  As directed    Call MD for:  severe uncontrolled pain   Complete by:  As directed    Call MD for:  temperature >100.4   Complete by:  As directed    Diet - low sodium heart healthy   Complete by:  As directed    Discharge instructions   Complete by:  As directed    Call [phone] for a followup  appointment. Take a stool softener while you are using pain medications.   Driving Restrictions   Complete by:  As directed    Do not drive for 2 weeks.   Increase activity slowly   Complete by:  As directed    Lifting restrictions   Complete by:  As directed    Do not lift more than 5 pounds. No excessive bending or twisting.   May shower / Bathe   Complete by:  As directed    Remove the dressing for 3 days after surgery.  You may shower, but leave the incision alone.     Allergies as of 01/26/2019      Reactions   Codeine    Unsure       Medication List    TAKE these medications   ascorbic acid 500 MG tablet Commonly known as:  VITAMIN C Take 500 mg by mouth daily.   b complex vitamins tablet Take 1 tablet by mouth daily.   Calcium-Magnesium 500-250 MG Tabs Take 1 tablet by mouth daily.   cetirizine 10 MG tablet Commonly known as:  ZYRTEC Take 10 mg by mouth daily as needed for allergies. Spring and fall   CoQ10 100 MG Caps Take 1 capsule by mouth daily.   docusate sodium 100 MG capsule Commonly known as:  COLACE Take 1 capsule (100 mg total) by mouth  2 (two) times daily.   HYDROmorphone 2 MG tablet Commonly known as:  DILAUDID Take 1 tablet (2 mg total) by mouth every 4 (four) hours as needed for severe pain.   Melatonin 3-10 MG Tabs Take 1 tablet by mouth daily.   metaxalone 800 MG tablet Commonly known as:  SKELAXIN Take 400 mg by mouth daily as needed for muscle spasms.   Vitamin D3 25 MCG (1000 UT) Caps Take 1 capsule by mouth daily.        Signed: Ophelia Charter 01/26/2019, 5:49 PM

## 2019-01-26 NOTE — Progress Notes (Signed)
PHARMACIST - PHYSICIAN ORDER COMMUNICATION  CONCERNING: P&T Medication Policy on Herbal Medications  DESCRIPTION:  This patient's order for:  Co Q10  has been noted.  This product(s) is classified as an "herbal" or natural product. Due to a lack of definitive safety studies or FDA approval, nonstandard manufacturing practices, plus the potential risk of unknown drug-drug interactions while on inpatient medications, the Pharmacy and Therapeutics Committee does not permit the use of "herbal" or natural products of this type within [facility].   ACTION TAKEN: The pharmacy department is unable to verify this order at this time. Please reevaluate patient's clinical condition at discharge and address if the herbal or natural product(s) should be resumed at that time.  Arty Baumgartner, [place] Pager: [phone] 01/26/2019 3:22 PM

## 2019-01-26 NOTE — Progress Notes (Signed)
Subjective: The patient is alert and pleasant.  She is in no apparent distress.  Objective: Vital signs in last 24 hours: Temp:  [97.2 F (36.2 C)-97.4 F (36.3 C)] 97.2 F (36.2 C) (01/27 1320) Pulse Rate:  [62-77] 62 (01/27 1420) Resp:  [12-18] 12 (01/27 1420) BP: (131-154)/(65-79) 131/67 (01/27 1420) SpO2:  [93 %-98 %] 95 % (01/27 1420) Weight:  [76.2 kg] 76.2 kg (01/27 0940) Estimated body mass index is 28.84 kg/m as calculated from the following:   Height as of this encounter: 5\' 4"  (1.626 m).   Weight as of this encounter: 76.2 kg.   Intake/Output from previous day: No intake/output data recorded. Intake/Output this shift: Total I/O In: 1100 [I.V.:1000; IV Piggyback:100] Out: -   Physical exam the patient is alert and pleasant.  She looks well.  She is moving her lower extremities well.  Lab Results: No results for input(s): WBC, HGB, HCT, PLT in the last 72 hours. BMET No results for input(s): NA, K, CL, CO2, GLUCOSE, BUN, CREATININE, CALCIUM in the last 72 hours.  Studies/Results: Dg Lumbar Spine 2-3 Views  Result Date: 01/26/2019 CLINICAL DATA:  L2 kyphoplasty. EXAM: LUMBAR SPINE - 2-3 VIEW; DG C-ARM 61-120 MIN FLUOROSCOPY TIME:  1 minutes 10 seconds. COMPARISON:  Radiographs of November 05, 2018. FINDINGS: Two intraoperative fluoroscopic images demonstrate the patient be status post kyphoplasty of L2 vertebral body. Old severe compression fracture of this area is noted. IMPRESSION: Status post L2 kyphoplasty. Electronically Signed   By: Marijo Conception, M.D.   On: 01/26/2019 13:49   Dg C-arm 1-60 Min  Result Date: 01/26/2019 CLINICAL DATA:  L2 kyphoplasty. EXAM: LUMBAR SPINE - 2-3 VIEW; DG C-ARM 61-120 MIN FLUOROSCOPY TIME:  1 minutes 10 seconds. COMPARISON:  Radiographs of November 05, 2018. FINDINGS: Two intraoperative fluoroscopic images demonstrate the patient be status post kyphoplasty of L2 vertebral body. Old severe compression fracture of this area is noted.  IMPRESSION: Status post L2 kyphoplasty. Electronically Signed   By: Marijo Conception, M.D.   On: 01/26/2019 13:49    Assessment/Plan: The patient is doing well.  I spoke with her daughter.  She may go home later today.  LOS: 0 days     Ophelia Charter 01/26/2019, 2:34 PM

## 2019-01-26 NOTE — Anesthesia Postprocedure Evaluation (Signed)
Anesthesia Post Note  Patient: Tammy Brown  Procedure(s) Performed: LUMBAR TWO KYPHOPLASTY (N/A Back)     Patient location during evaluation: PACU Anesthesia Type: General Level of consciousness: awake and alert Pain management: pain level controlled Vital Signs Assessment: post-procedure vital signs reviewed and stable Respiratory status: spontaneous breathing, nonlabored ventilation, respiratory function stable and patient connected to nasal cannula oxygen Cardiovascular status: blood pressure returned to baseline and stable Postop Assessment: no apparent nausea or vomiting Anesthetic complications: no    Last Vitals:  Vitals:   01/26/19 1435 01/26/19 1511  BP: 127/67 137/61  Pulse: 62 63  Resp: (!) 9 18  Temp: 36.6 C 36.6 C  SpO2: 99% 94%    Last Pain:  Vitals:   01/26/19 1710  TempSrc:   PainSc: 3                  Jalon Blackwelder

## 2019-01-26 NOTE — Evaluation (Signed)
Physical Therapy Evaluation Patient Details Name: Tammy Brown MRN: [id number] DOB: 29-Jul-1936 Today's Date: 01/26/2019   History of Present Illness  Patient is an 83 yo female s/p L2 kyphoplasty  Clinical Impression  Patient seen for mobility assessment s/p spinal surgery. Mobilizing well. Educated patient on precautions, mobility expectations, safety and car transfers. No further acute PT needs. Will sign off.    Follow Up Recommendations No PT follow up;Supervision/Assistance - 24 hour    Equipment Recommendations  None recommended by PT    Recommendations for Other Services       Precautions / Restrictions Precautions Precautions: Back Precaution Booklet Issued: Yes (comment) Precaution Comments: verbally reviewed with patient Required Braces or Orthoses: Spinal Brace Spinal Brace: Lumbar corset Restrictions Weight Bearing Restrictions: No      Mobility  Bed Mobility Overal bed mobility: Needs Assistance Bed Mobility: Rolling;Sidelying to Sit;Sit to Sidelying Rolling: Supervision Sidelying to sit: Supervision     Sit to sidelying: Supervision General bed mobility comments: supervision for safety and cues for back precautions  Transfers Overall transfer level: Needs assistance   Transfers: Sit to/from Stand Sit to Stand: Supervision         General transfer comment: Supervision for safety  Ambulation/Gait Ambulation/Gait assistance: Supervision Gait Distance (Feet): 280 Feet Assistive device: None Gait Pattern/deviations: Step-through pattern;Decreased stride length Gait velocity: decreased Gait velocity interpretation: <1.8 ft/sec, indicate of risk for recurrent falls General Gait Details: modest instability intially but improved with cues for increased cadence  Stairs            Wheelchair Mobility    Modified Rankin (Stroke Patients Only)       Balance Overall balance assessment: Mild deficits observed, not formally tested                                           Pertinent Vitals/Pain Pain Assessment: Faces Faces Pain Scale: Hurts even more Pain Location: low back/buttocks Pain Descriptors / Indicators: Sore Pain Intervention(s): Monitored during session    Home Living Family/patient expects to be discharged to:: Private residence Living Arrangements: Spouse/significant other Available Help at Discharge: Family Type of Home: House Home Access: Stairs to enter   Technical brewer of Steps: 1(threshold) Home Layout: One level        Prior Function Level of Independence: Independent               Hand Dominance   Dominant Hand: Right    Extremity/Trunk Assessment   Upper Extremity Assessment Upper Extremity Assessment: Generalized weakness    Lower Extremity Assessment Lower Extremity Assessment: Generalized weakness       Communication   Communication: HOH  Cognition Arousal/Alertness: Awake/alert Behavior During Therapy: WFL for tasks assessed/performed Overall Cognitive Status: Within Functional Limits for tasks assessed                                        General Comments      Exercises     Assessment/Plan    PT Assessment Patent does not need any further PT services  PT Problem List         PT Treatment Interventions      PT Goals (Current goals can be found in the Care Plan section)  Acute Rehab PT Goals PT Goal Formulation:  All assessment and education complete, DC therapy    Frequency     Barriers to discharge        Co-evaluation               AM-PAC PT "6 Clicks" Mobility  Outcome Measure Help needed turning from your back to your side while in a flat bed without using bedrails?: None Help needed moving from lying on your back to sitting on the side of a flat bed without using bedrails?: None Help needed moving to and from a bed to a chair (including a wheelchair)?: A Little Help needed standing up from a  chair using your arms (e.g., wheelchair or bedside chair)?: A Little Help needed to walk in hospital room?: A Little Help needed climbing 3-5 steps with a railing? : A Little 6 Click Score: 20    End of Session Equipment Utilized During Treatment: Back brace Activity Tolerance: Patient tolerated treatment well Patient left: in bed;with call bell/phone within reach;with family/visitor present Nurse Communication: Mobility status PT Visit Diagnosis: Unsteadiness on feet (R26.81);Difficulty in walking, not elsewhere classified (R26.2)    Time: 7741-4239 PT Time Calculation (min) (ACUTE ONLY): 19 min   Charges:   PT Evaluation $PT Eval Low Complexity: [place], PT DPT  Board Certified Neurologic Specialist Acute Rehabilitation Services Pager [phone] Office [phone]   Duncan Dull 01/26/2019, 3:55 PM

## 2019-01-26 NOTE — Progress Notes (Signed)
Orthopedic Tech Progress Note Patient Details:  Tammy Brown July 18, 1936 [id number] Called in order  Patient ID: KEMANI HEIDEL, female   DOB: [birth year], 83 y.o.   MRN: [id number]   Janit Pagan 01/26/2019, 1:38 PM

## 2019-01-26 NOTE — Transfer of Care (Signed)
Immediate Anesthesia Transfer of Care Note  Patient: Tammy Brown  Procedure(s) Performed: LUMBAR TWO KYPHOPLASTY (N/A Back)  Patient Location: PACU  Anesthesia Type:General  Level of Consciousness: awake, alert , oriented and patient cooperative  Airway & Oxygen Therapy: Patient Spontanous Breathing and Patient connected to nasal cannula oxygen  Post-op Assessment: Report given to RN, Post -op Vital signs reviewed and stable and Patient moving all extremities X 4  Post vital signs: Reviewed and stable  Last Vitals:  Vitals Value Taken Time  BP 140/70 01/26/2019  1:19 PM  Temp    Pulse 69 01/26/2019  1:20 PM  Resp 18 01/26/2019  1:20 PM  SpO2 100 % 01/26/2019  1:20 PM  Vitals shown include unvalidated device data.  Last Pain:  Vitals:   01/26/19 1012  TempSrc:   PainSc: 2       Patients Stated Pain Goal: 1 (60/04/59 9774)  Complications: No apparent anesthesia complications

## 2019-01-28 ENCOUNTER — Encounter (HOSPITAL_COMMUNITY): Payer: Self-pay | Admitting: Neurosurgery

## 2019-08-27 IMAGING — DX DG LUMBAR SPINE COMPLETE 4+V
5 series · 5 of 5 positions shown · non-contrast
Comparison: Abdominal CT 07/02/2010

CLINICAL DATA: Follow-up L2 compression fracture.

EXAM:
LUMBAR SPINE - COMPLETE 4+ VIEW

[l-spine ap]
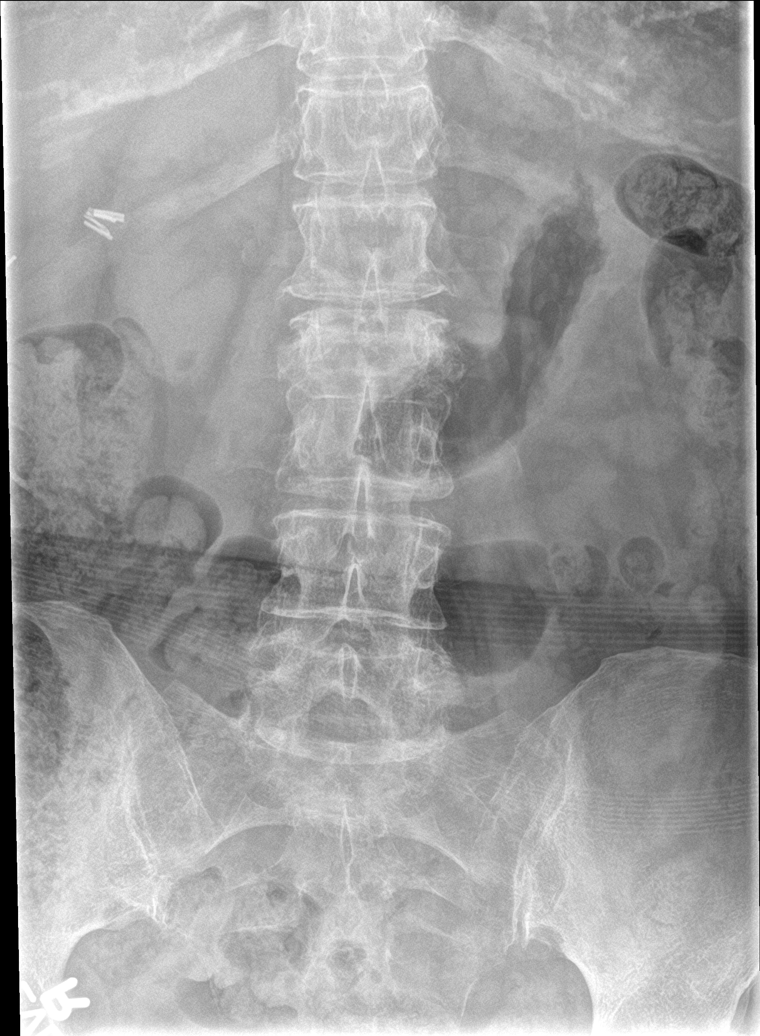

[l-spine obl (1 of 2)]
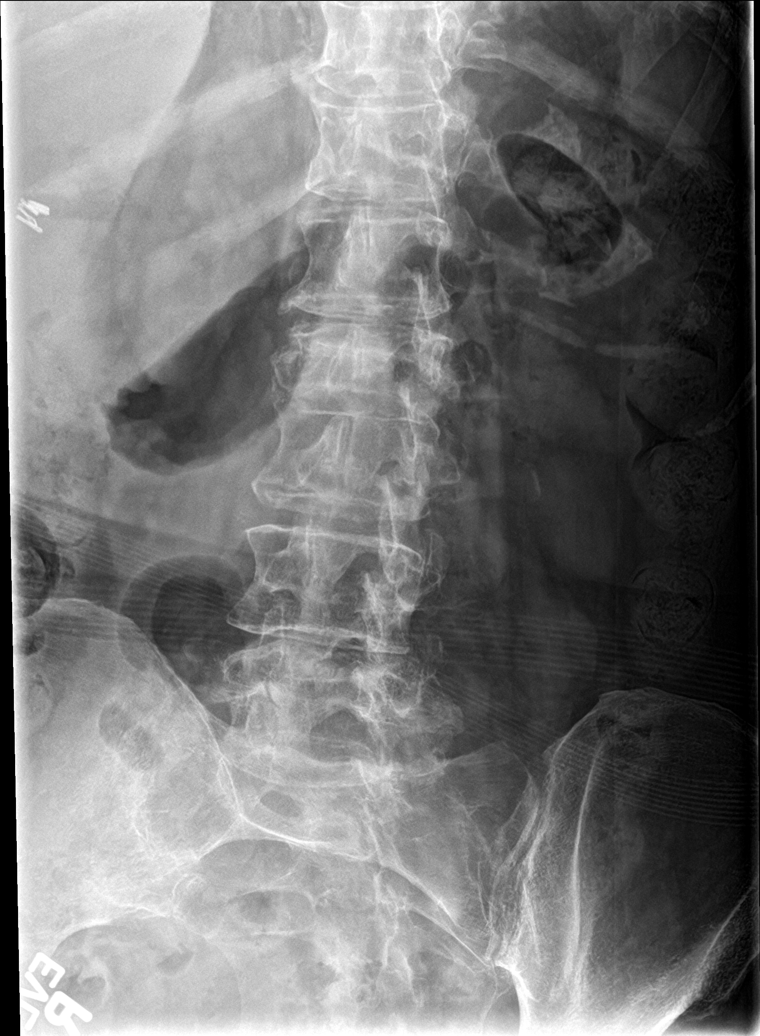

[l-spine obl (2 of 2)]
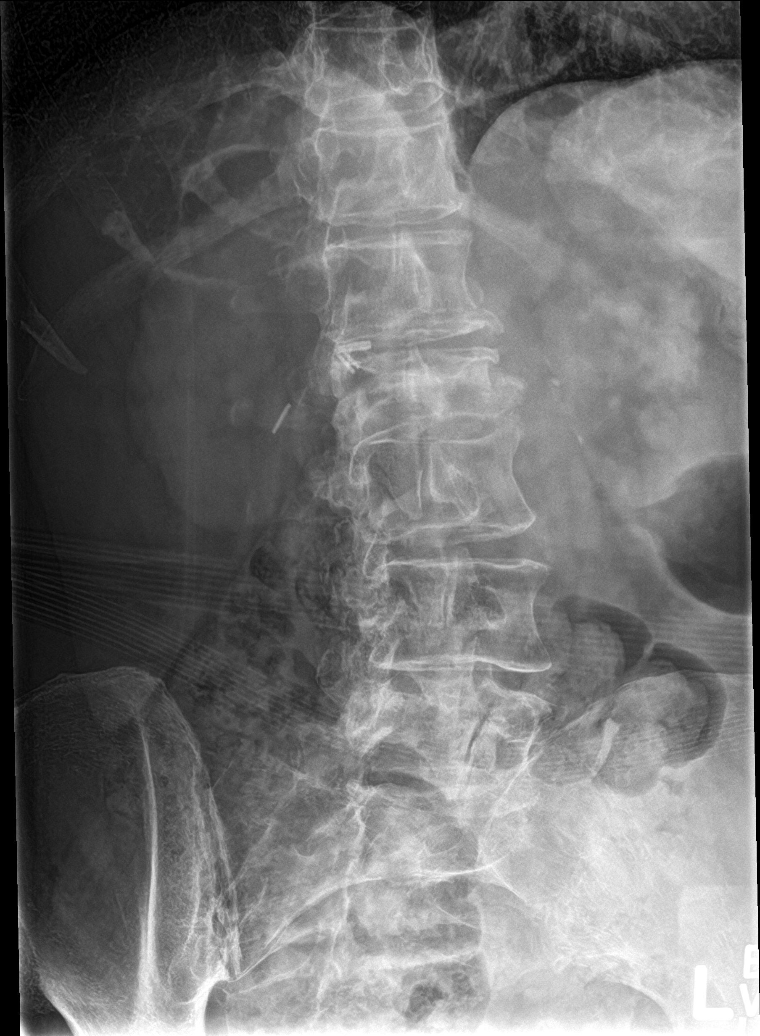

[l-spine lat]
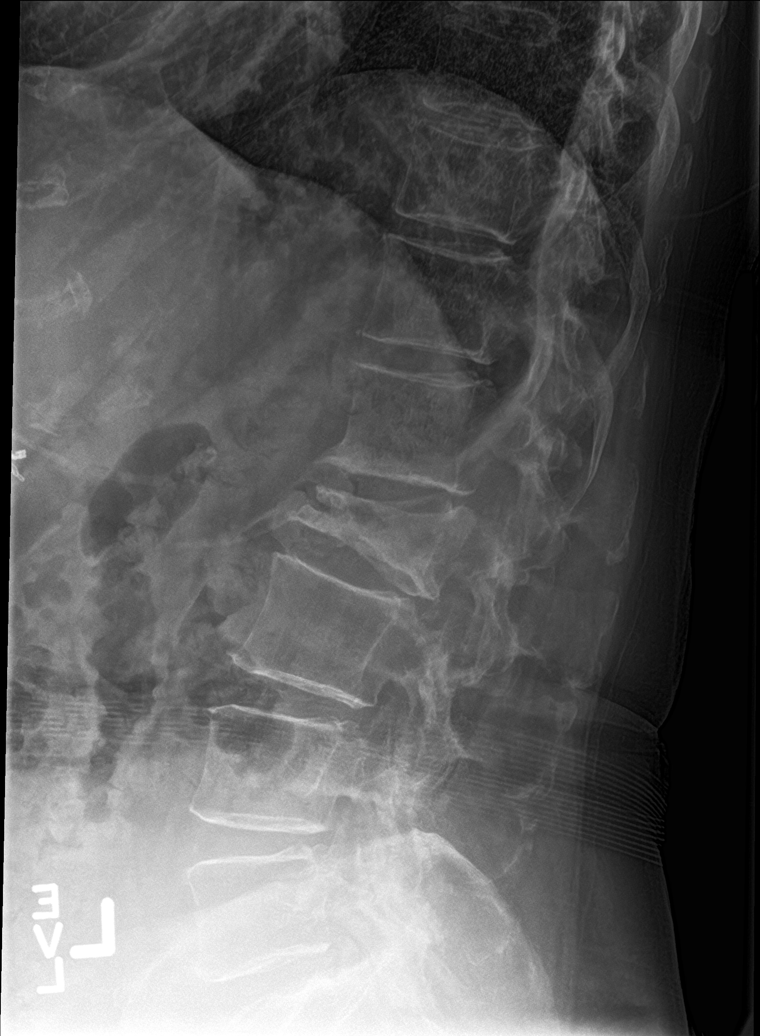

[l-spine spot]
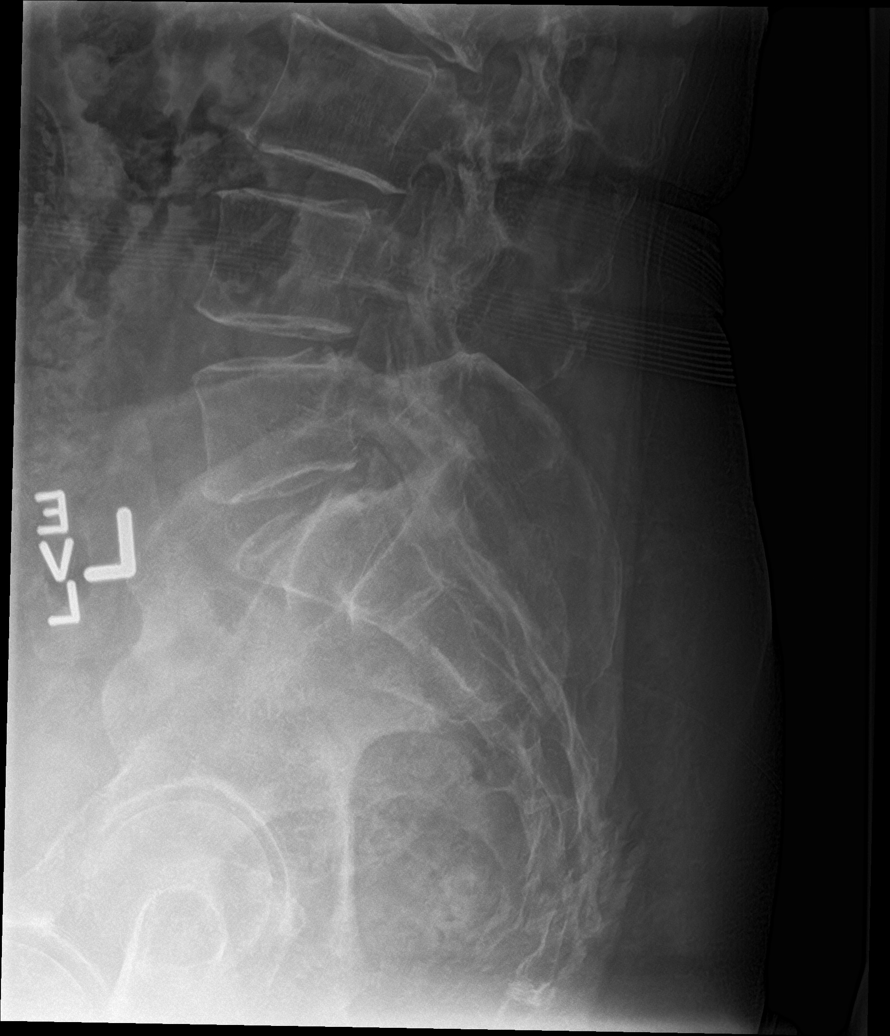

[5 of 5 positions shown; findings below may reference images not displayed]

FINDINGS: L2 compression fracture with advanced anterior height loss and
retropulsion. There is also a degree of L2-3 retrolisthesis. No
discrete fracture line is seen to suggest acuity.

Generalized disc narrowing is mild for age. Mild lower lumbar facet
spurring.
IMPRESSION: History of L2 compression fracture. Height loss was described as 10%
on outside lumbar spine MRI report 07/28/2018 and has progressed to
advanced anterior collapse. There is also retropulsion and L2-3
retrolisthesis.

## 2019-11-17 IMAGING — RF DG C-ARM 61-120 MIN
1 series · 1 of 1 positions shown · non-contrast
Comparison: Radiographs November 05, 2018.

CLINICAL DATA: L2 kyphoplasty.

EXAM:
LUMBAR SPINE - 2-3 VIEW; DG C-ARM 61-120 MIN
FLUOROSCOPY TIME:  1 minutes 10 seconds.

[Series 1: run · 1 of 1 slices shown]
[im 1/1]
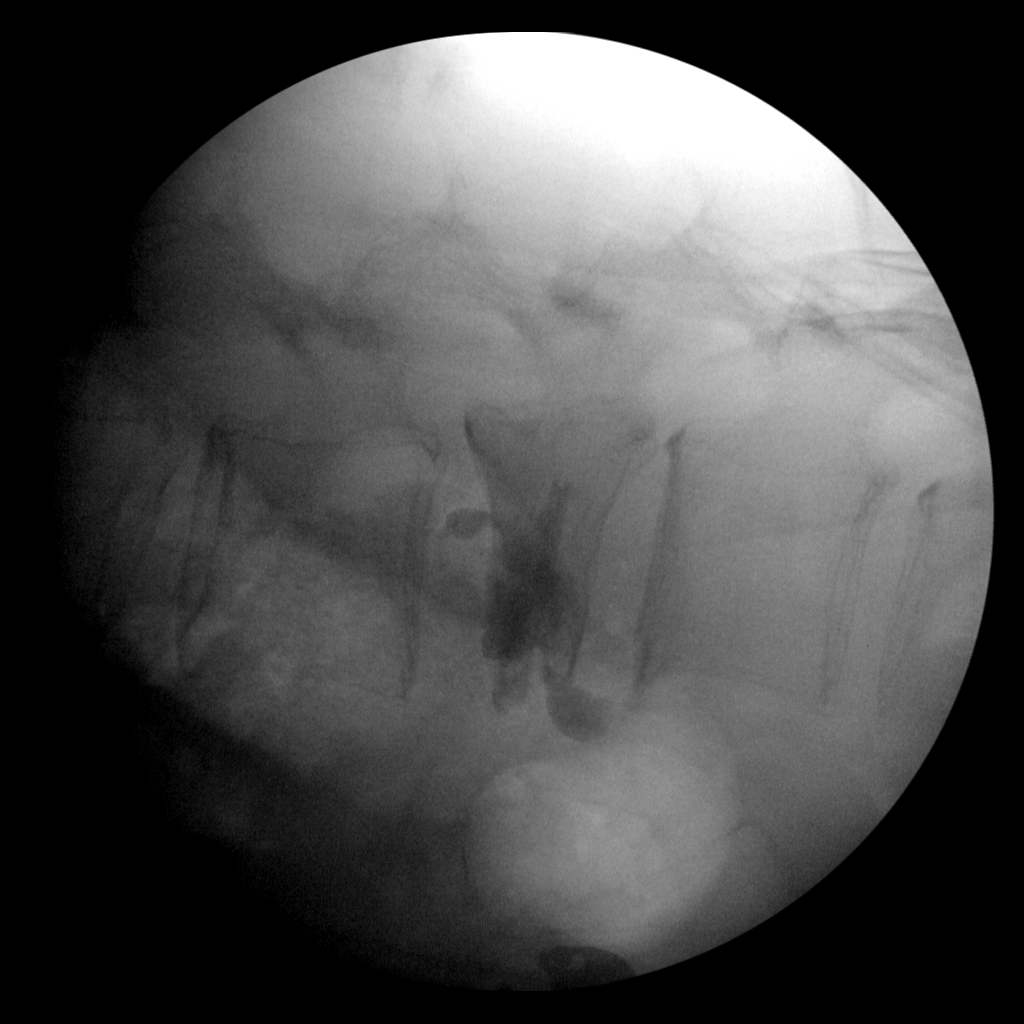

[1 of 1 positions shown; findings below may reference images not displayed]

FINDINGS: Two intraoperative fluoroscopic images demonstrate the patient be
status post kyphoplasty of L2 vertebral body. Old severe compression
fracture of this area is noted.
IMPRESSION: Status post L2 kyphoplasty.
